# Patient Record
Sex: Female | Born: 1980 | Race: Black or African American | Hispanic: No | Marital: Single | Smoking: Never smoker
Health system: Southern US, Community
[De-identification: ages and names within clinical notes are randomized; demographics above are authoritative.]

---
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
## 2001-12-19 ENCOUNTER — Inpatient Hospital Stay (HOSPITAL_COMMUNITY): Admission: EM | Admit: 2001-12-19 | Discharge: 2001-12-21 | Payer: Self-pay | Admitting: Pediatrics

## 2002-02-15 ENCOUNTER — Encounter: Admission: RE | Admit: 2002-02-15 | Discharge: 2002-02-15 | Payer: Self-pay | Admitting: Internal Medicine

## 2002-02-15 ENCOUNTER — Encounter: Payer: Self-pay | Admitting: Internal Medicine

## 2003-12-27 ENCOUNTER — Encounter: Admission: RE | Admit: 2003-12-27 | Discharge: 2003-12-27 | Payer: Self-pay | Admitting: Internal Medicine

## 2005-03-21 IMAGING — CR DG CHEST 2V
2 series · 2 of 2 positions shown · non-contrast
Comparison: none

CLINICAL DATA: Cough.   
 CHEST, TWO VIEWS
 The heart size and mediastinal contours are normal.  The lungs are clear.  The visualized skeleton is unremarkable.  Submaximal inspiration is seen.  No comparison. 
 IMPRESSION
 No active disease.

[view not recorded (1 of 2)]
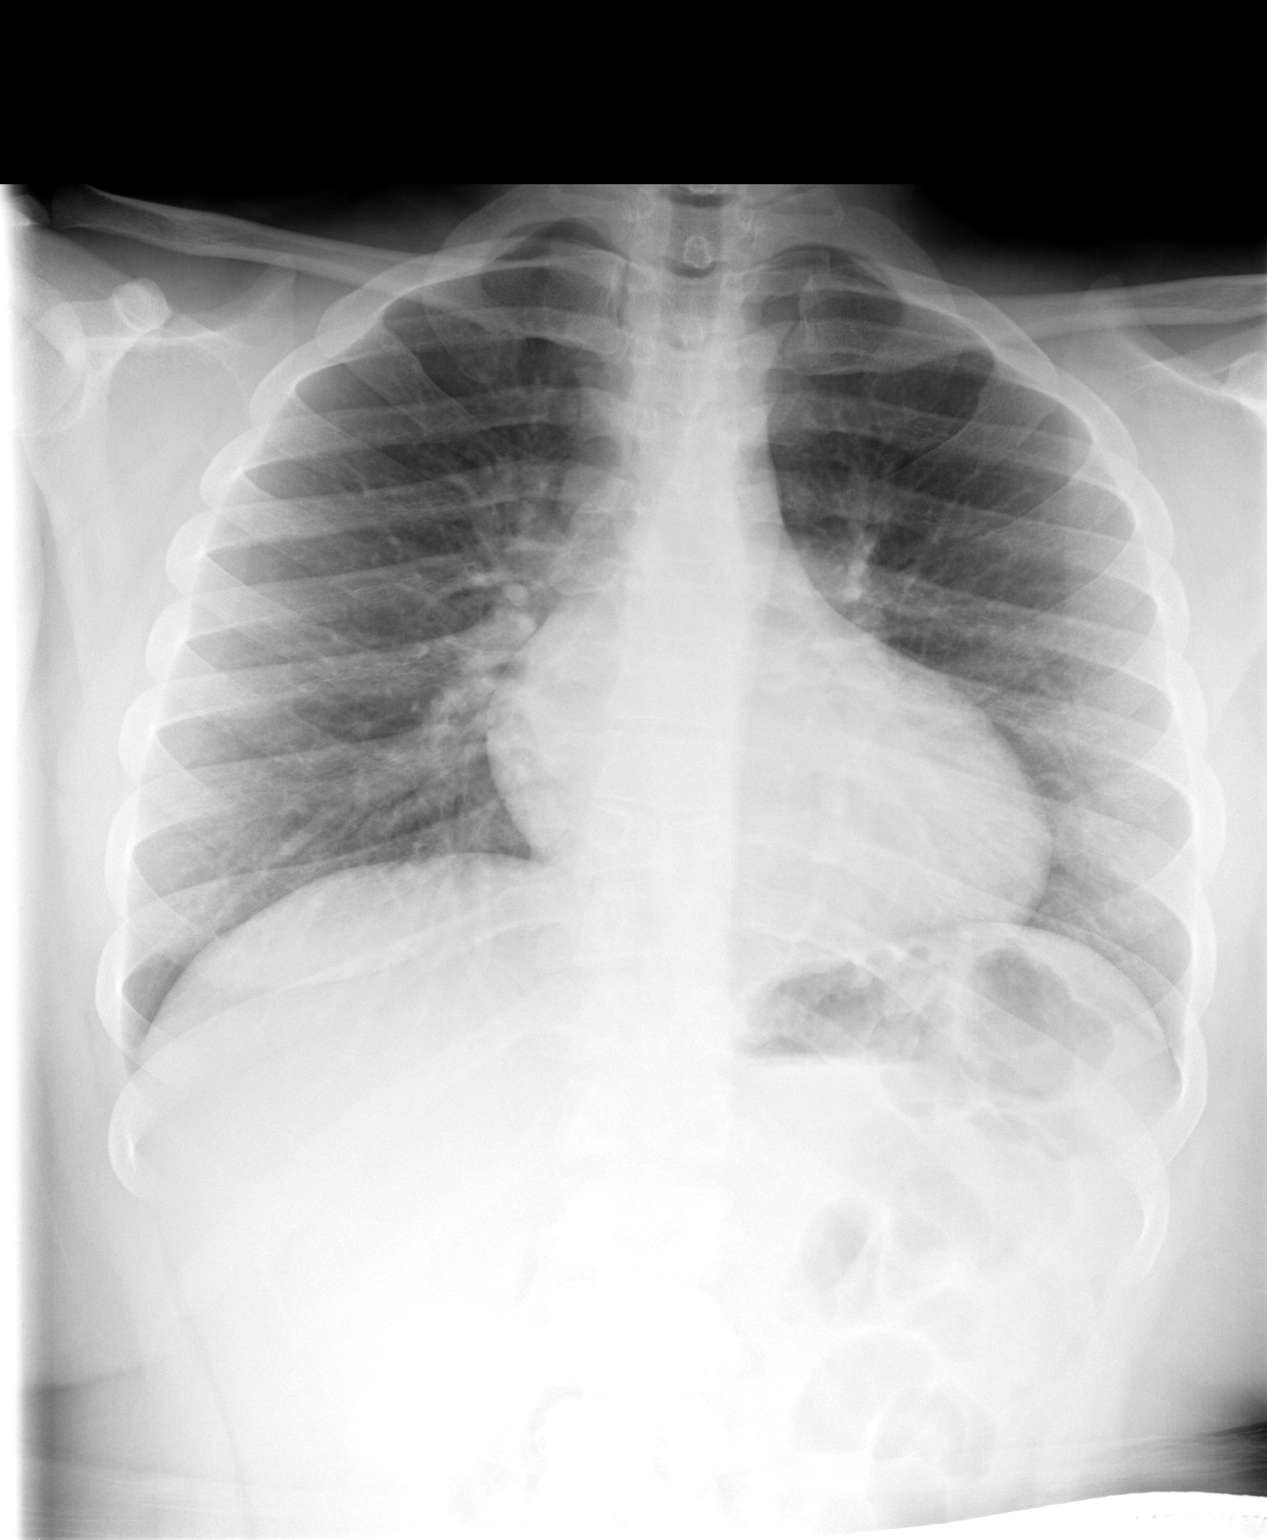

[view not recorded (2 of 2)]
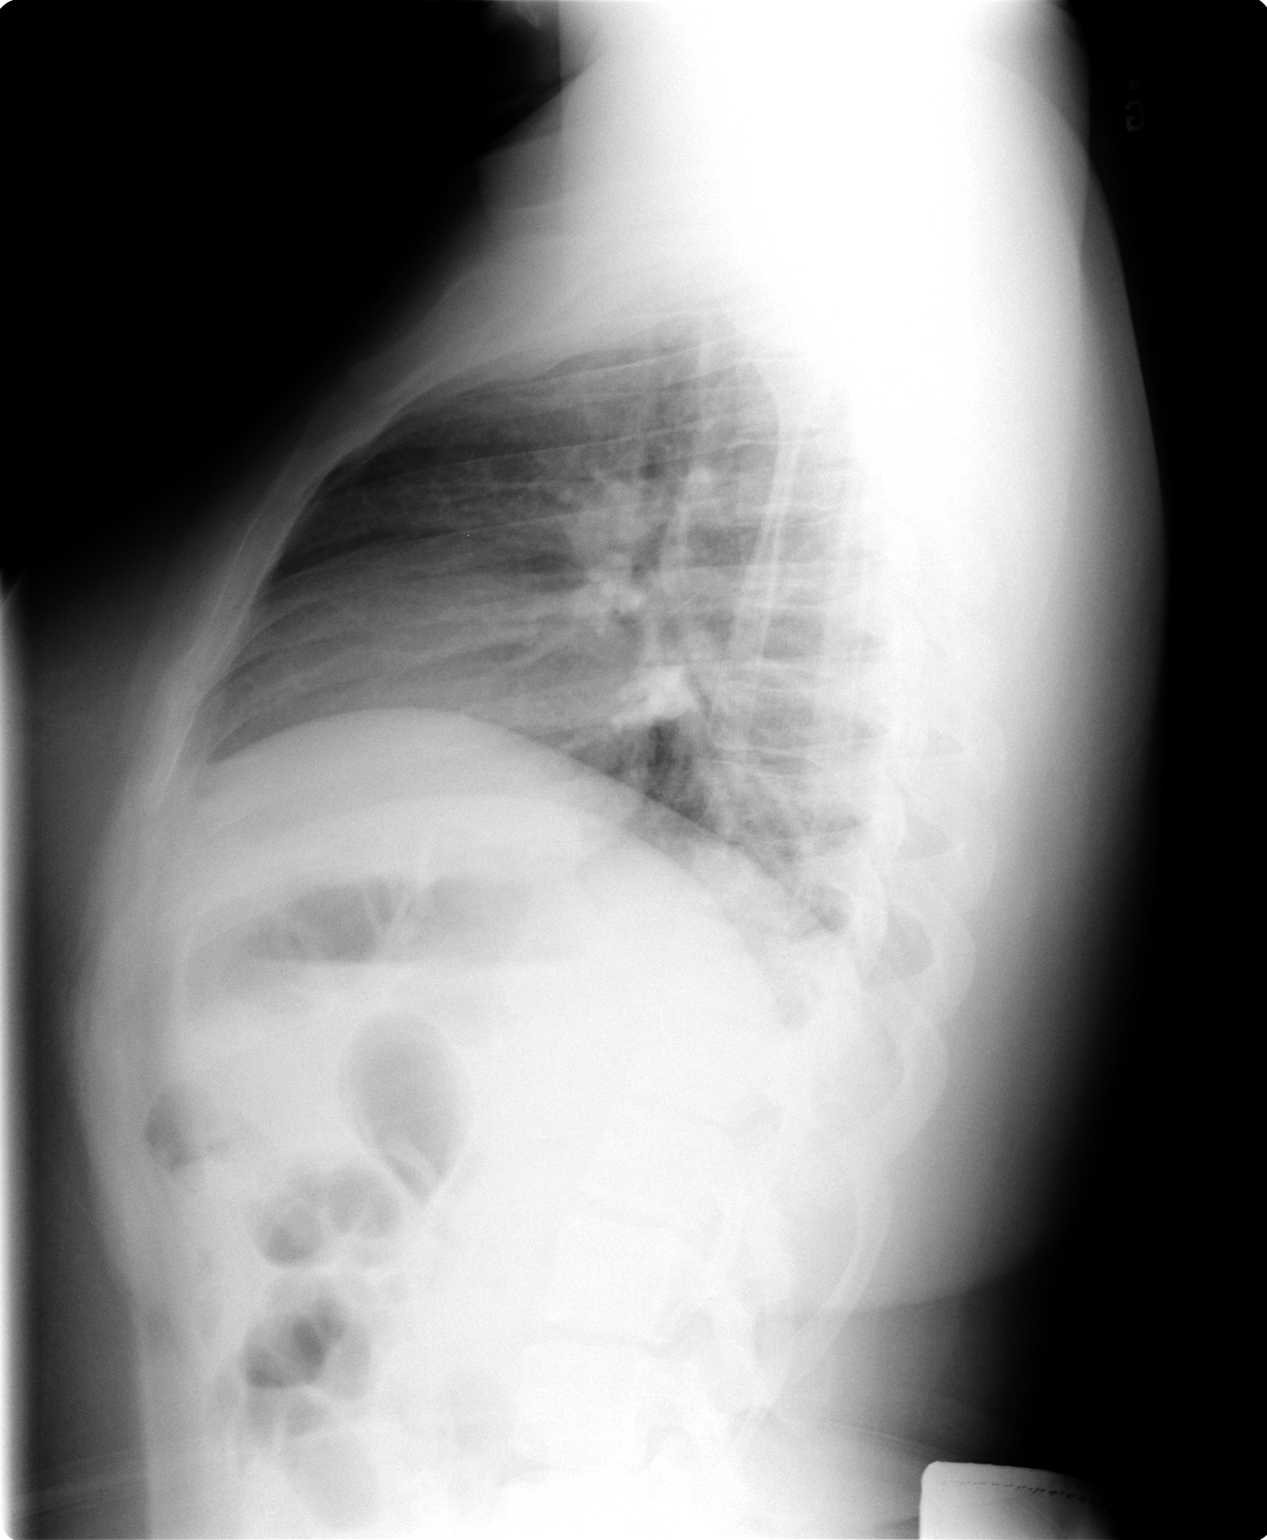

[2 of 2 positions shown; findings below may reference images not displayed]

## 2014-08-08 ENCOUNTER — Emergency Department (INDEPENDENT_AMBULATORY_CARE_PROVIDER_SITE_OTHER)
Admission: EM | Admit: 2014-08-08 | Discharge: 2014-08-08 | Disposition: A | Discharge status: Home / Self Care | Payer: Self-pay | Source: Home / Self Care | Attending: Family Medicine | Admitting: Family Medicine

## 2014-08-08 ENCOUNTER — Encounter (HOSPITAL_COMMUNITY): Payer: Self-pay | Admitting: Emergency Medicine

## 2014-08-08 DIAGNOSIS — J039 Acute tonsillitis, unspecified: Secondary | ICD-10-CM

## 2014-08-08 LAB — POCT INFECTIOUS MONO SCREEN: MONO SCREEN: NEGATIVE

## 2014-08-08 LAB — POCT RAPID STREP A: Streptococcus, Group A Screen (Direct): POSITIVE — AB

## 2014-08-08 MED ORDER — CLINDAMYCIN HCL 300 MG PO CAPS: 300.0000 mg | ORAL_CAPSULE | Freq: Four times a day (QID) | ORAL | Status: AC

## 2014-08-08 NOTE — ED Notes (Signed)
Patient made aware of delays; She had no needs at this time

## 2014-08-08 NOTE — ED Notes (Signed)
Apologized for delay in care secondary to department acuity

## 2014-08-08 NOTE — ED Provider Notes (Signed)
CSN: [id number]     Arrival date & time 08/08/14  1309 History   First MD Initiated Contact with Patient 08/08/14 1341     Chief Complaint  Patient presents with  . 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 Self diagnosed with strep (no swab done) and self prescribed PCN. Has been using PCN and Ibuprofen for about one week and symptoms have not improved.  Reports herself to be in otherwise good health Nonsmoker Is here for the weekend visiting her parents.   Patient is a 33 y.o. female presenting with pharyngitis. The history is provided by the patient.  Sore Throat This is a new problem. Episode onset: sx began one week ago. The problem occurs constantly. The problem has not changed since onset.   History reviewed. No pertinent past medical history. Past Surgical History  Procedure Laterality Date  . Cesarean section     No family history on file. History  Substance Use Topics  . Smoking status: Never Smoker   . Smokeless tobacco: Not on file  . Alcohol Use: Yes   OB History    No data available     Review of Systems  Constitutional: Positive for fever and chills.  HENT: Positive for congestion and sore throat. Negative for trouble swallowing.   Eyes: Negative.   Respiratory: Negative.   Cardiovascular: Negative.   Gastrointestinal: Negative.   Musculoskeletal: Positive for myalgias.  Skin: Negative.     Allergies  Review of patient's allergies indicates no known allergies.  Home Medications   Prior to Admission medications   Medication Sig Start Date End Date Taking? Authorizing Provider  OVER THE COUNTER MEDICATION antibiotic   Yes Historical Provider, MD  clindamycin (CLEOCIN) 300 MG capsule Take 1 capsule (300 mg total) by mouth 4 (four) times daily. X 7 days 08/08/14   Jess BartersJennifer Lee H Arbutus Nelligan, PA   BP 125/78 mmHg   Pulse 82  Temp(Src) 99.6 F (37.6 C) (Oral)  Resp 18  SpO2 99% Physical Exam  Constitutional: She is oriented to person, place, and time. She appears well-developed and well-nourished. No distress.  HENT:  Head: Normocephalic and atraumatic.  Right Ear: Hearing, tympanic membrane, external ear and ear canal normal. No decreased hearing is noted.  Left Ear: Hearing, tympanic membrane, external ear and ear canal normal.  Nose: Nose normal.  Mouth/Throat: Uvula is midline and mucous membranes are normal. No oral lesions. No trismus in the jaw. No uvula swelling. Oropharyngeal exudate and posterior oropharyngeal erythema present. No posterior oropharyngeal edema or tonsillar abscesses.  Bilateral exudative tonsillitis  Eyes: Conjunctivae are normal. No scleral icterus.  Neck: Normal range of motion. Neck supple.  Cardiovascular: Normal rate, regular rhythm and normal heart sounds.   Pulmonary/Chest: Effort normal and breath sounds normal. No stridor. No respiratory distress. She has no wheezes.  Musculoskeletal: Normal range of motion.  Lymphadenopathy:    She has no cervical adenopathy.  Neurological: She is alert and oriented to person, place, and time.  Skin: Skin is warm and dry. No rash noted. No erythema.  Psychiatric: She has a normal mood and affect. Her behavior is normal.  Nursing note and vitals reviewed.   ED Course  Procedures (including critical care time) Labs Review Labs Reviewed  POCT RAPID STREP A ([facility] ONLY) - Abnormal; Notable for the following:    Streptococcus, Group A Screen (Direct) POSITIVE (*)    All other  components within normal limits  POCT INFECTIOUS MONO SCREEN    Imaging Review No results found.   MDM   1. Tonsillitis with exudate    Rapid strep positive. Swab sent for culture and sensitivity as I have concern that she has not responded to PCN. Will switch to clindamycin 300mg  po QID x 7 days and advise close follow up if no improvement.   MonoSpot negative  No clinical indication of peritonsillar abscess.     Ria ClockJennifer Lee H Julliette Frentz, [place] 08/08/14 [id number]

## 2014-08-08 NOTE — Discharge Instructions (Signed)
Rapid strep positive. Swab sent for culture and sensitivity as I have concern that you have not responded favorably to PCN. Will investigate for resistant organisms. Will switch to clindamycin 300mg  po QID x 7 days and advise close follow up if no improvement.  MonoSpot negative  No clinical indication of peritonsillar abscess.   Tonsillitis Tonsillitis is an infection of the throat that causes the tonsils to become red, tender, and swollen. Tonsils are collections of lymphoid tissue at the back of the throat. Each tonsil has crevices (crypts). Tonsils help fight nose and throat infections and keep infection from spreading to other parts of the body for the first 18 months of life.  CAUSES Sudden (acute) tonsillitis is usually caused by infection with streptococcal bacteria. Long-lasting (chronic) tonsillitis occurs when the crypts of the tonsils become filled with pieces of food and bacteria, which makes it easy for the tonsils to become repeatedly infected. SYMPTOMS  Symptoms of tonsillitis include:  A sore throat, with possible difficulty swallowing.  White patches on the tonsils.  Fever.  Tiredness.  New episodes of snoring during sleep, when you did not snore before.  Small, foul-smelling, yellowish-white pieces of material (tonsilloliths) that you occasionally cough up or spit out. The tonsilloliths can also cause you to have bad breath. DIAGNOSIS Tonsillitis can be diagnosed through a physical exam. Diagnosis can be confirmed with the results of lab tests, including a throat culture. TREATMENT  The goals of tonsillitis treatment include the reduction of the severity and duration of symptoms and prevention of associated conditions. Symptoms of tonsillitis can be improved with the use of steroids to reduce the swelling. Tonsillitis caused by bacteria can be treated with antibiotic medicines. Usually, treatment with antibiotic medicines is started before the cause of the tonsillitis is  known. However, if it is determined that the cause is not bacterial, antibiotic medicines will not treat the tonsillitis. If attacks of tonsillitis are severe and frequent, your health care provider may recommend surgery to remove the tonsils (tonsillectomy). HOME CARE INSTRUCTIONS   Rest as much as possible and get plenty of sleep.  Drink plenty of fluids. While the throat is very sore, eat soft foods or liquids, such as sherbet, soups, or instant breakfast drinks.  Eat frozen ice pops.  Gargle with a warm or cold liquid to help soothe the throat. Mix 1/4 teaspoon of salt and 1/4 teaspoon of baking soda in 8 oz of water. SEEK MEDICAL CARE IF:   Large, tender lumps develop in your neck.  A rash develops.  A green, yellow-brown, or bloody substance is coughed up.  You are unable to swallow liquids or food for 24 hours.  You notice that only one of the tonsils is swollen. SEEK IMMEDIATE MEDICAL CARE IF:   You develop any new symptoms such as vomiting, severe headache, stiff neck, chest pain, or trouble breathing or swallowing.  You have severe throat pain along with drooling or voice changes.  You have severe pain, unrelieved with recommended medications.  You are unable to fully open the mouth.  You develop redness, swelling, or severe pain anywhere in the neck.  You have a fever. MAKE SURE YOU:   Understand these instructions.  Will watch your condition.  Will get help right away if you are not doing well or get worse. Document Released: 06/19/2005 Document Revised: 01/24/2014 Document Reviewed: 02/26/2013 [facility] Patient Information 2015 [employer] [place]. This information is not intended to replace advice given to you by your health care provider.  Make sure you discuss any questions you have with your health care provider.  Strep Throat Strep throat is an infection of the throat caused by a bacteria named Streptococcus pyogenes. Your health care provider may call the  infection streptococcal "tonsillitis" or "pharyngitis" depending on whether there are signs of inflammation in the tonsils or back of the throat. Strep throat is most common in children aged 5-15 years during the cold months of the year, but it can occur in people of any age during any season. This infection is spread from person to person (contagious) through coughing, sneezing, or other close contact. SIGNS AND SYMPTOMS   Fever or chills.  Painful, swollen, red tonsils or throat.  Pain or difficulty when swallowing.  White or yellow spots on the tonsils or throat.  Swollen, tender lymph nodes or "glands" of the neck or under the jaw.  Red rash all over the body (rare). DIAGNOSIS  Many different infections can cause the same symptoms. A test must be done to confirm the diagnosis so the right treatment can be given. A "rapid strep test" can help your health care provider make the diagnosis in a few minutes. If this test is not available, a light swab of the infected area can be used for a throat culture test. If a throat culture test is done, results are usually available in a day or two. TREATMENT  Strep throat is treated with antibiotic medicine. HOME CARE INSTRUCTIONS   Gargle with 1 tsp of salt in 1 cup of warm water, 3-4 times per day or as needed for comfort.  Family members who also have a sore throat or fever should be tested for strep throat and treated with antibiotics if they have the strep infection.  Make sure everyone in your household washes their hands well.  Do not share food, drinking cups, or personal items that could cause the infection to spread to others.  You may need to eat a soft food diet until your sore throat gets better.  Drink enough water and fluids to keep your urine clear or pale yellow. This will help prevent dehydration.  Get plenty of rest.  Stay home from school, day care, or work until you have been on antibiotics for 24 hours.  Take medicines  only as directed by your health care provider.  Take your antibiotic medicine as directed by your health care provider. Finish it even if you start to feel better. SEEK MEDICAL CARE IF:   The glands in your neck continue to enlarge.  You develop a rash, cough, or earache.  You cough up green, yellow-brown, or bloody sputum.  You have pain or discomfort not controlled by medicines.  Your problems seem to be getting worse rather than better.  You have a fever. SEEK IMMEDIATE MEDICAL CARE IF:   You develop any new symptoms such as vomiting, severe headache, stiff or painful neck, chest pain, shortness of breath, or trouble swallowing.  You develop severe throat pain, drooling, or changes in your voice.  You develop swelling of the neck, or the skin on the neck becomes red and tender.  You develop signs of dehydration, such as fatigue, dry mouth, and decreased urination.  You become increasingly sleepy, or you cannot wake up completely. MAKE SURE YOU:  Understand these instructions.  Will watch your condition.  Will get help right away if you are not doing well or get worse. Document Released: 09/06/2000 Document Revised: 01/24/2014 Document Reviewed: 11/08/2010 [employer] Patient Information  2015 [employer]. This information is not intended to replace advice given to you by your health care provider. Make sure you discuss any questions you have with your health care provider. ° °

## 2014-08-08 NOTE — ED Notes (Signed)
C/o sore throat.  Has been on antibiotic for 4 days and no improvement. Sore throat for one week

## 2014-08-10 LAB — CULTURE, GROUP A STREP
